# Patient Record
Sex: Male | Born: 2000 | Race: White | Hispanic: No | Marital: Single | Smoking: Never smoker
Health system: Southern US, Community
[De-identification: ages and names within clinical notes are randomized; demographics above are authoritative.]

---
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
## 2023-01-10 ENCOUNTER — Emergency Department (HOSPITAL_BASED_OUTPATIENT_CLINIC_OR_DEPARTMENT_OTHER)
Admission: EM | Admit: 2023-01-10 | Discharge: 2023-01-10 | Disposition: A | Discharge status: Home / Self Care | Payer: BC Managed Care – PPO | Attending: Emergency Medicine | Admitting: Emergency Medicine

## 2023-01-10 ENCOUNTER — Encounter (HOSPITAL_BASED_OUTPATIENT_CLINIC_OR_DEPARTMENT_OTHER): Payer: Self-pay | Admitting: Emergency Medicine

## 2023-01-10 ENCOUNTER — Emergency Department (HOSPITAL_BASED_OUTPATIENT_CLINIC_OR_DEPARTMENT_OTHER): Payer: BC Managed Care – PPO

## 2023-01-10 ENCOUNTER — Other Ambulatory Visit: Payer: Self-pay

## 2023-01-10 DIAGNOSIS — R519 Headache, unspecified: Secondary | ICD-10-CM

## 2023-01-10 DIAGNOSIS — J322 Chronic ethmoidal sinusitis: Secondary | ICD-10-CM | POA: Diagnosis not present

## 2023-01-10 LAB — CBC
HCT: 40.9 % (ref 39.0–52.0)
Hemoglobin: 14.2 g/dL (ref 13.0–17.0)
MCH: 29.8 pg (ref 26.0–34.0)
MCHC: 34.7 g/dL (ref 30.0–36.0)
MCV: 85.9 fL (ref 80.0–100.0)
Platelets: 224 10*3/uL (ref 150–400)
RBC: 4.76 MIL/uL (ref 4.22–5.81)
RDW: 12.4 % (ref 11.5–15.5)
WBC: 7.4 10*3/uL (ref 4.0–10.5)
nRBC: 0 % (ref 0.0–0.2)

## 2023-01-10 LAB — BASIC METABOLIC PANEL
Anion gap: 9 (ref 5–15)
BUN: 13 mg/dL (ref 6–20)
CO2: 25 mmol/L (ref 22–32)
Calcium: 9.8 mg/dL (ref 8.9–10.3)
Chloride: 106 mmol/L (ref 98–111)
Creatinine, Ser: 0.82 mg/dL (ref 0.61–1.24)
GFR, Estimated: >60 mL/min (ref >60)
Glucose, Bld: 105 mg/dL — ABNORMAL HIGH (ref 70–99)
Potassium: 3.8 mmol/L (ref 3.5–5.1)
Sodium: 140 mmol/L (ref 135–145)

## 2023-01-10 MED ORDER — METOCLOPRAMIDE HCL 5 MG/ML IJ SOLN
5.0000 mg | Freq: Once | INTRAMUSCULAR | Status: AC
  Administered 2023-01-10: 5 mg via INTRAVENOUS
  Filled 2023-01-10: qty 2

## 2023-01-10 MED ORDER — METOCLOPRAMIDE HCL 10 MG PO TABS: 5.0000 mg | ORAL_TABLET | Freq: Four times a day (QID) | ORAL | 0 refills | Status: AC | PRN

## 2023-01-10 MED ORDER — METHYLPREDNISOLONE 4 MG PO TBPK: ORAL_TABLET | ORAL | 0 refills | Status: AC

## 2023-01-10 MED ORDER — KETOROLAC TROMETHAMINE 30 MG/ML IJ SOLN
15.0000 mg | Freq: Once | INTRAMUSCULAR | Status: AC
  Administered 2023-01-10: 15 mg via INTRAVENOUS
  Filled 2023-01-10: qty 1

## 2023-01-10 MED ORDER — DIPHENHYDRAMINE HCL 50 MG/ML IJ SOLN
12.5000 mg | Freq: Once | INTRAMUSCULAR | Status: AC
  Administered 2023-01-10: 12.5 mg via INTRAVENOUS
  Filled 2023-01-10: qty 1

## 2023-01-10 MED ORDER — NAPROXEN 375 MG PO TABS: 375.0000 mg | ORAL_TABLET | Freq: Two times a day (BID) | ORAL | 0 refills | Status: AC | PRN

## 2023-01-10 MED ORDER — DEXAMETHASONE SODIUM PHOSPHATE 10 MG/ML IJ SOLN
10.0000 mg | Freq: Once | INTRAMUSCULAR | Status: AC
  Administered 2023-01-10: 10 mg via INTRAVENOUS
  Filled 2023-01-10: qty 1

## 2023-01-10 NOTE — ED Provider Notes (Cosign Needed Addendum)
[place] EMERGENCY DEPARTMENT AT [facility] Provider Note   CSN: [id number] Arrival date & time: 01/10/23  1058     History  Chief Complaint  Patient presents with   Headache    Dennis Baldwin is a 21 y.o. male who presents emergency department for frontal headache.  Patient has had a constant headache for the past 2 weeks.  He states that it is worse in the morning, better in the middle of the day and worse at night.  He states that it has been intermittent but has now been constant for the past 4 days.  He denies any vision changes unilateral weakness difficulty with speech or swallowing, sensory changes.  He has had some sensitivity to light and sound.  Patient reports that he has had no history of headaches or migraines.  He has been taking Advil and now Tylenol daily for the past several weeks without significant improvement.  He denies nausea or vomiting.  He has seen an ear nose and throat doctor and been to an urgent care about the headaches.  He was diagnosed with tension headache.  He has some tightness in his neck but no nuchal rigidity.  He has been vaccinated for meningitis prior to going to college.  He has an appointment with an eye doctor in the next several months.  He does wear corrective lenses.   Headache      Home Medications Prior to Admission medications   Not on File      Allergies    Patient has no known allergies.    Review of Systems   Review of Systems  Neurological:  Positive for headaches.    Physical Exam Updated Vital Signs BP (!) 147/71 (BP Location: Right Arm)   Pulse 64   Temp 98.3 F (36.8 C) (Oral)   Resp 16   Ht 6' (1.829 m)   Wt 72.6 kg   SpO2 99%   BMI 21.70 kg/m  Physical Exam Physical Exam  Constitutional: Pt is oriented to person, place, and time. Pt appears well-developed and well-nourished. No distress.  HENT:  Head: Normocephalic and atraumatic.  Mouth/Throat: Oropharynx is clear and moist.  Eyes:  Conjunctivae and EOM are normal. Pupils are equal, round, and reactive to light. No scleral icterus.  No horizontal, vertical or rotational nystagmus  Neck: Normal range of motion. Neck supple.  Full active and passive ROM without pain No midline or paraspinal tenderness No nuchal rigidity or meningeal signs  Cardiovascular: Normal rate, regular rhythm and intact distal pulses.   Pulmonary/Chest: Effort normal and breath sounds normal. No respiratory distress. Pt has no wheezes. No rales.  Abdominal: Soft. Bowel sounds are normal. There is no tenderness. There is no rebound and no guarding.  Musculoskeletal: Normal range of motion.  Lymphadenopathy:    No cervical adenopathy.  Neurological: Pt. is alert and oriented to person, place, and time. He has normal reflexes. No cranial nerve deficit.  Exhibits normal muscle tone. Coordination normal.  Mental Status:  Alert, oriented, thought content appropriate. Speech fluent without evidence of aphasia. Able to follow 2 step commands without difficulty.  Cranial Nerves:  II:  Peripheral visual fields grossly normal, pupils equal, round, reactive to light III,IV, VI: ptosis not present, extra-ocular motions intact bilaterally  V,VII: smile symmetric, facial light touch sensation equal VIII: hearing grossly normal bilaterally  IX,X: midline uvula rise  XI: bilateral shoulder shrug equal and strong XII: midline tongue extension  Motor:  5/5 in upper and lower  extremities bilaterally including strong and equal grip strength and dorsiflexion/plantar flexion Sensory: Pinprick and light touch normal in all extremities.  Deep Tendon Reflexes: 2+ and symmetric  Cerebellar: normal finger-to-nose with bilateral upper extremities Gait: normal gait and balance CV: distal pulses palpable throughout   Skin: Skin is warm and dry. No rash noted. Pt is not diaphoretic.  Psychiatric: Pt has a normal mood and affect. Behavior is normal. Judgment and thought  content normal.  Nursing note and vitals reviewed.  ED Results / Procedures / Treatments   Labs (all labs ordered are listed, but only abnormal results are displayed) Labs Reviewed  BASIC METABOLIC PANEL - Abnormal; Notable for the following components:      Result Value   Glucose, Bld 105 (*)    All other components within normal limits  CBC    EKG None  Radiology No results found.  Procedures Procedures    Medications Ordered in ED Medications  diphenhydrAMINE (BENADRYL) injection 12.5 mg (12.5 mg Intravenous Given 01/10/23 1241)  ketorolac (TORADOL) 30 MG/ML injection 15 mg (15 mg Intravenous Given 01/10/23 1241)  metoCLOPramide (REGLAN) injection 5 mg (5 mg Intravenous Given 01/10/23 1241)    ED Course/ Medical Decision Making/ A&P                             Medical Decision Making Dennis Baldwin presents with headache Given the large differential diagnosis for Dennis Baldwin, the decision making in this case is of high complexity.  After evaluating all of the data points in this case, the presentation of Dennis Baldwin is NOT consistent with skull fracture, meningitis/encephalitis, SAH/sentinel bleed, Intracranial Hemorrhage (ICH) (subdural/epidural), acute obstructive hydrocephalus, space occupying lesions, CVA, CO Poisoning, Basilar/vertebral artery dissection, preeclampsia, cerebral venous thrombosis, hypertensive emergency, temporal Arteritis, Idiopathic Intracranial Hypertension (pseudotumor cerebri).  Strict return and follow-up precautions have been given by me personally or by detailed written instructions verbalized by nursing staff using the teach back method to patient/family/caregiver.  Data Reviewed/Counseling: I have reviewed the patient's vital signs, nursing notes, and other relevant tests/information. I had a detailed discussion regarding the historical points, exam findings, and any diagnostic results supporting the discharge diagnosis. I also  discussed the need for outpatient follow-up and the need to return to the ED if symptoms worsen or if there are any questions or concerns that arise at hom    Amount and/or Complexity of Data Reviewed Independent Historian: parent Labs: ordered.    Details: Labs reviewed, no acute findings Radiology: ordered and independent interpretation performed. Decision-making details documented in ED Course.    Details: Visualized and interpreted CT head which showed no acute findings.  Risk Prescription drug management.  Here with intractable headache for the last 2 months.  I had a long discussion with both parents by bedside.  I do not think he has any emergency findings here on the examination.  He has no evidence of meningitis SAH.  He does have some ethmoid sinusitis noted on the CT scan which could be the cause of his frontal headache.  He was given a dose of Decadron here and will be discharged with Medrol Dosepak.  His headache is not significantly improved after medications for migraine headache.  Patient has no neurologic deficits on exam and I will give him an ambulatory referral to neurology for further management of his headache.  He appears appropriate for discharge.  I answered all questions to the best of my ability  at this time.        Final Clinical Impression(s) / ED Diagnoses Final diagnoses:  Acute intractable headache, unspecified headache type  Chronic ethmoidal sinusitis    Rx / DC Orders ED Discharge Orders     None         Arthor Captain, PA-C 01/10/23 1549    Arthor Captain, PA-C 01/10/23 1550    Zadie Rhine, MD 01/13/23 [phone]

## 2023-01-10 NOTE — ED Triage Notes (Signed)
Pt arrives to ED with c/o frontal headache x2 months. He notes the intermittent headaches but recently it is constant. Associated with chills and mild neck pain. He has seen ENT and dentist for this issue. 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

## 2023-01-10 NOTE — Discharge Instructions (Addendum)

## 2023-06-14 ENCOUNTER — Other Ambulatory Visit: Payer: Self-pay | Admitting: Allergy and Immunology

## 2023-06-14 ENCOUNTER — Ambulatory Visit
Admission: RE | Admit: 2023-06-14 | Discharge: 2023-06-14 | Disposition: A | Discharge status: Home / Self Care | Payer: BC Managed Care – PPO | Source: Ambulatory Visit | Attending: Allergy and Immunology | Admitting: Allergy and Immunology

## 2023-06-14 DIAGNOSIS — R0602 Shortness of breath: Secondary | ICD-10-CM
